# Patient Record
Sex: Male | Born: 1995 | Race: White | Hispanic: No | Marital: Single | Smoking: Never smoker
Health system: Southern US, Community
[De-identification: ages and names within clinical notes are randomized; demographics above are authoritative.]

## PROBLEM LIST (undated history)

## (undated) HISTORY — PX: NO PAST SURGERIES: SHX2092

---

## 2018-06-12 ENCOUNTER — Ambulatory Visit (INDEPENDENT_AMBULATORY_CARE_PROVIDER_SITE_OTHER): Payer: BLUE CROSS/BLUE SHIELD

## 2018-06-12 ENCOUNTER — Other Ambulatory Visit: Payer: Self-pay

## 2018-06-12 ENCOUNTER — Ambulatory Visit
Admission: EM | Admit: 2018-06-12 | Discharge: 2018-06-12 | Disposition: A | Discharge status: Home / Self Care | Payer: BLUE CROSS/BLUE SHIELD | Attending: Urgent Care | Admitting: Urgent Care

## 2018-06-12 DIAGNOSIS — Z7189 Other specified counseling: Secondary | ICD-10-CM

## 2018-06-12 DIAGNOSIS — R05 Cough: Secondary | ICD-10-CM

## 2018-06-12 DIAGNOSIS — B9789 Other viral agents as the cause of diseases classified elsewhere: Secondary | ICD-10-CM

## 2018-06-12 DIAGNOSIS — J069 Acute upper respiratory infection, unspecified: Secondary | ICD-10-CM

## 2018-06-12 DIAGNOSIS — M940 Chondrocostal junction syndrome [Tietze]: Secondary | ICD-10-CM | POA: Diagnosis not present

## 2018-06-12 DIAGNOSIS — R059 Cough, unspecified: Secondary | ICD-10-CM

## 2018-06-12 MED ORDER — IBUPROFEN 800 MG PO TABS: 800.0000 mg | ORAL_TABLET | Freq: Three times a day (TID) | ORAL | 0 refills | Status: AC | PRN

## 2018-06-12 MED ORDER — BENZONATATE 200 MG PO CAPS: 200.0000 mg | ORAL_CAPSULE | Freq: Three times a day (TID) | ORAL | 0 refills | Status: AC | PRN

## 2018-06-12 NOTE — Discharge Instructions (Addendum)
It was very nice meeting you today in clinic. Thank you for entrusting me with your care.   As discussed, your chest film was negative. Suspect your symptoms are viral in nature. No need for antibiotics at this time. I do believe your chest pain to be associated with inflammation. Will need anti-inflammatory medication to help with the pain.   Please utilize the medications that we discussed. Your prescriptions have been called in to your pharmacy. I have sent in anti-inflammatory medication and pills to help with your cough.   Make arrangements to follow up with your regular doctor in 1 week for re-evaluation if not improving. If your symptoms/condition worsens, please seek follow up care either here or in the ER. Please remember, our [facility] providers are "right here with you" when you need [place].   Again, it was my pleasure to take care of you today. Thank you for choosing our clinic. I hope that you start to feel better quickly.   Quentin Mulling, MSN, APRN, FNP-C, CEN Advanced Practice Provider [facility]

## 2018-06-12 NOTE — ED Provider Notes (Signed)
[address], [address] [place], [place] [address] [phone]   Name: Craig Walton DOB: [birth year] MRN: [id number] CSN: [id number] PCP: Patient, No Pcp Per  Arrival date and time:  06/12/18 1142  Chief Complaint:  Cough  NOTE: Prior to seeing the patient today, I have reviewed the triage nursing documentation and vital signs. Clinical staff has updated patient's PMH/PSHx, current medication list, and drug allergies/intolerances to ensure comprehensive history available to assist in medical decision making.   History:   HPI: Craig Walton is a 23 y.o. male who presents today with complaints of cough x 1 week. He initially experienced some associated pharyngitis, which resolved with conservative treatment at home. Patient notes that while at work earlier this morning he developed retrosternal chest pain.  Pain not reported to radiate into shoulder, jaw, neck, or subscapular area. He notes associated episodes of shortness of breath that are mainly associated with periods of intense coughing. No nausea, vomiting, or diaphoresis. Pain is reproducible with both cough and deep inspiration. Patient states, "it is like a knife sticking in my chest when I take a deep breath or cough".  He denies fevers. He has not been in close contact with anyone who has been sick recently.   History reviewed. No pertinent past medical history.  Past Surgical History:  Procedure Laterality Date  . NO PAST SURGERIES      History reviewed. No pertinent family history.  Social History   Socioeconomic History  . Marital status: Single    Spouse name: Not on file  . Number of children: Not on file  . Years of education: Not on file  . Highest education level: Not on file  Occupational History  . Not on file  Social Needs  . Financial resource strain: Not on file  . Food insecurity:    Worry: Not on file    Inability: Not on file  . Transportation needs:    Medical: Not on file    Non-medical: Not on  file  Tobacco Use  . Smoking status: Never Smoker  . Smokeless tobacco: Never Used  Substance and Sexual Activity  . Alcohol use: Never    Frequency: Never  . Drug use: Never  . Sexual activity: Not on file  Lifestyle  . Physical activity:    Days per week: Not on file    Minutes per session: Not on file  . Stress: Not on file  Relationships  . Social connections:    Talks on phone: Not on file    Gets together: Not on file    Attends religious service: Not on file    Active member of club or organization: Not on file    Attends meetings of clubs or organizations: Not on file    Relationship status: Not on file  . Intimate partner violence:    Fear of current or ex partner: Not on file    Emotionally abused: Not on file    Physically abused: Not on file    Forced sexual activity: Not on file  Other Topics Concern  . Not on file  Social History Narrative  . Not on file    There are no active problems to display for this patient.   Home Medications:    No outpatient medications have been marked as taking for the 06/12/18 encounter [facility](Hospital Encounter).    Allergies:   Patient has no known allergies.  Review of Systems (ROS): Review of Systems  Constitutional: Negative for chills and fever.  HENT: Positive for sore throat (initially with onset of symptoms x 1 week ago; resolved). Negative for congestion, ear pain, rhinorrhea and sinus pain.   Respiratory: Positive for cough (intermittently productive x 1 week) and shortness of breath (slight).   Cardiovascular: Positive for chest pain (pleuritic). Negative for palpitations.  Gastrointestinal: Negative for nausea and vomiting.  Musculoskeletal: Negative for back pain, myalgias and neck pain.  Neurological: Negative for dizziness, weakness and headaches.     Physical Exam:  Triage Vital Signs ED Triage Vitals  Enc Vitals Group     BP 06/12/18 1157 138/86     Pulse Rate 06/12/18 1157 85     Resp 06/12/18 1157 16      Temp 06/12/18 1157 98.8 F (37.1 C)     Temp Source 06/12/18 1157 Oral     SpO2 06/12/18 1157 100 %     Weight 06/12/18 1156 140 lb (63.5 kg)     Height 06/12/18 1156  (1.905 m)     Head Circumference --      Peak Flow --      Pain Score 06/12/18 1155 5     Pain Loc --      Pain Edu? --      Excl. in GC? --     Physical Exam  Constitutional: He is oriented to person, place, and time and well-developed, well-nourished, and in no distress.  HENT:  Head: Normocephalic and atraumatic.  Right Ear: External ear normal.  Left Ear: External ear normal.  Mouth/Throat: Mucous membranes are normal. Posterior oropharyngeal erythema (slight) present. No oropharyngeal exudate or posterior oropharyngeal edema.  Eyes: Pupils are equal, round, and reactive to light. EOM are normal.  Neck: Normal range of motion. Neck supple. No tracheal deviation present.  Cardiovascular: Normal rate, regular rhythm, normal heart sounds and intact distal pulses. Exam reveals no gallop and no friction rub.  No murmur heard. Pulmonary/Chest: Effort normal and breath sounds normal. No respiratory distress. He has no wheezes. He has no rales. He exhibits tenderness (generalized anterior chest wall).  Lymphadenopathy:    He has no cervical adenopathy.  Neurological: He is alert and oriented to person, place, and time.  Skin: Skin is warm and dry. No rash noted. No erythema.  Psychiatric: Mood, affect and judgment normal.  Nursing note and vitals reviewed.    Urgent Care Treatments / Results:   LABS: PLEASE NOTE: all labs that were ordered this encounter are listed, however only abnormal results are displayed. Labs Reviewed - No data to display  EKG: -None  RADIOLOGY: Dg Chest 2 View  Result Date: 06/12/2018 CLINICAL DATA:  Cough EXAM: CHEST - 2 VIEW COMPARISON:  None. FINDINGS: Lungs are clear. The heart size and pulmonary vascularity are normal. No adenopathy. No pneumothorax. No bone lesions.  There is slight midthoracic dextroscoliosis. There is an azygos lobe on the right, an anatomic variant. IMPRESSION: No edema or consolidation. Electronically Signed   By: Bretta Bang III M.D.   On: 06/12/2018 12:24    PRODEDURES: Procedures  MEDICATIONS RECEIVED THIS VISIT: Medications - No data to display  PERTINENT CLINICAL COURSE NOTES/UPDATES: No data to display   Initial Impression / Assessment and Plan / Urgent Care Course:    Craig Walton is a 23 y.o. male who presents to [facility] today with complaints of Cough  Pertinent labs & imaging results that were available during my care of the patient were personally reviewed by me and considered in my medical  decision making (see lab/imaging section of note for values and interpretations).  Exam reassuring. Pain in chest favored to be pleuritic in nature secondary to cough. Patient denies fevers. CXR reviewed as negative for any acute cardiopulmonary process; no consolidation, infiltrates, or evidence of peribronchial thickening. Symptoms consistent with a viral URI. Forceful coughing has caused patient to develop pleuritic chest pain. Pain reproducible with deep inspiration, cough, and when direct pressure applied to anterior chest wall. Suspect degree of associated costochondritis. Patient inquiring about COVID-19 infection. Patient is well appearing with very mild symptoms. Discussed that suspicion of COVID infection is low at this point, however in the absence of testing, it could not be ruled out. Discussed supportive symptomatic care at home. Will treat with IBU and benzonatate TID PRN. Patient encouraged to rest and increase fluid intake.   Current clinical condition warrants patient being out of work in order to recover from his current injury/illness. He was provided with the appropriate documentation to provide to his place of employment that will allow for him to RTW on 06/13/2018 with no restrictions.   Discussed  follow up with primary care physician in 1 week for re-evaluation. I have reviewed the follow up and strict return precautions for any new or worsening symptoms. Patient is aware of symptoms that would be deemed urgent/emergent, and would thus require further evaluation either here or in the emergency department. At the time of discharge, he verbalized understanding and consent with the discharge plan as it was reviewed with him. All questions were fielded by provider and/or clinic staff prior to patient discharge.    Final Clinical Impressions(s) / Urgent Care Diagnoses:   Final diagnoses:  Cough  Costochondritis  Viral URI with cough  Advice Given About Covid-19 Virus Infection    New Prescriptions:   Meds ordered this encounter  Medications  . ibuprofen (ADVIL) 800 MG tablet    Sig: Take 1 tablet (800 mg total) by mouth every 8 (eight) hours as needed.    Dispense:  15 tablet    Refill:  0  . benzonatate (TESSALON) 200 MG capsule    Sig: Take 1 capsule (200 mg total) by mouth 3 (three) times daily as needed for up to 7 days for cough.    Dispense:  21 capsule    Refill:  0    Controlled Substance Prescriptions:  [place] Controlled Substance Registry consulted? Not Applicable  NOTE: This note was prepared using Dragon dictation software along with smaller phrase technology. Despite my best ability to proofread, there is the potential that transcriptional errors may still occur from this process, and are completely unintentional.     Verlee Monte, NP 06/12/18 1245

## 2018-06-12 NOTE — ED Triage Notes (Signed)
Patient complains of cough that started 1 week ago and while at work this morning he started noticing a sharp pain in his chest. Patient denies fever or body aches.

## 2018-08-19 ENCOUNTER — Ambulatory Visit: Payer: BC Managed Care – PPO | Admitting: Podiatry

## 2018-08-19 ENCOUNTER — Encounter: Payer: Self-pay | Admitting: Podiatry

## 2018-08-19 ENCOUNTER — Other Ambulatory Visit: Payer: Self-pay

## 2018-08-19 VITALS — Temp 98.2°F

## 2018-08-19 DIAGNOSIS — L600 Ingrowing nail: Secondary | ICD-10-CM

## 2018-08-19 MED ORDER — GENTAMICIN SULFATE 0.1 % EX CREA: 1.0000 "application " | TOPICAL_CREAM | Freq: Two times a day (BID) | CUTANEOUS | 1 refills | Status: AC

## 2018-08-21 NOTE — Progress Notes (Signed)
   Subjective: Patient presents today for evaluation of pain to the medial border of the left hallux that began about one month ago. He reports some associated drainage. Patient is concerned for possible ingrown nail. Touching the toe increases the pain. He has been soaking it in Epsom salt for treatment. Patient presents today for further treatment and evaluation.  No past medical history on file.  Objective:  General: Well developed, nourished, in no acute distress, alert and oriented x3   Dermatology: Skin is warm, dry and supple bilateral. Medial border left hallux appears to be erythematous with evidence of an ingrowing nail. Pain on palpation noted to the border of the nail fold. The remaining nails appear unremarkable at this time. There are no open sores, lesions.  Vascular: Dorsalis Pedis artery and Posterior Tibial artery pedal pulses palpable. No lower extremity edema noted.   Neruologic: Grossly intact via light touch bilateral.  Musculoskeletal: Muscular strength within normal limits in all groups bilateral. Normal range of motion noted to all pedal and ankle joints.   Assesement: #1 Paronychia with ingrowing nail medial border left hallux  #2 Pain in toe #3 Incurvated nail  Plan of Care:  1. Patient evaluated.  2. Discussed treatment alternatives and plan of care. Explained nail avulsion procedure and post procedure course to patient. 3. Patient opted for permanent partial nail avulsion of the medial border left hallux.  4. Prior to procedure, local anesthesia infiltration utilized using 3 ml of a 50:50 mixture of 2% plain lidocaine and 0.5% plain marcaine in a normal hallux block fashion and a betadine prep performed.  5. Partial permanent nail avulsion with chemical matrixectomy performed using [id number] applications of phenol followed by alcohol flush.  6. Light dressing applied. 7. Prescription for Gentamicin cream provided to patient to use daily with a bandage.  8.  Return to clinic in 2 weeks.   Edrick Kins, DPM [facility]  Dr. Edrick Kins, [address]                                        [place], [place] [address]                Office [phone]  Fax [phone]

## 2018-09-05 ENCOUNTER — Ambulatory Visit: Payer: BC Managed Care – PPO | Admitting: Podiatry

## 2020-12-04 IMAGING — CR CHEST - 2 VIEW
2 series · 2 of 2 positions shown · non-contrast
Comparison: None.

CLINICAL DATA: Cough

EXAM:
CHEST - 2 VIEW

[chest pa]
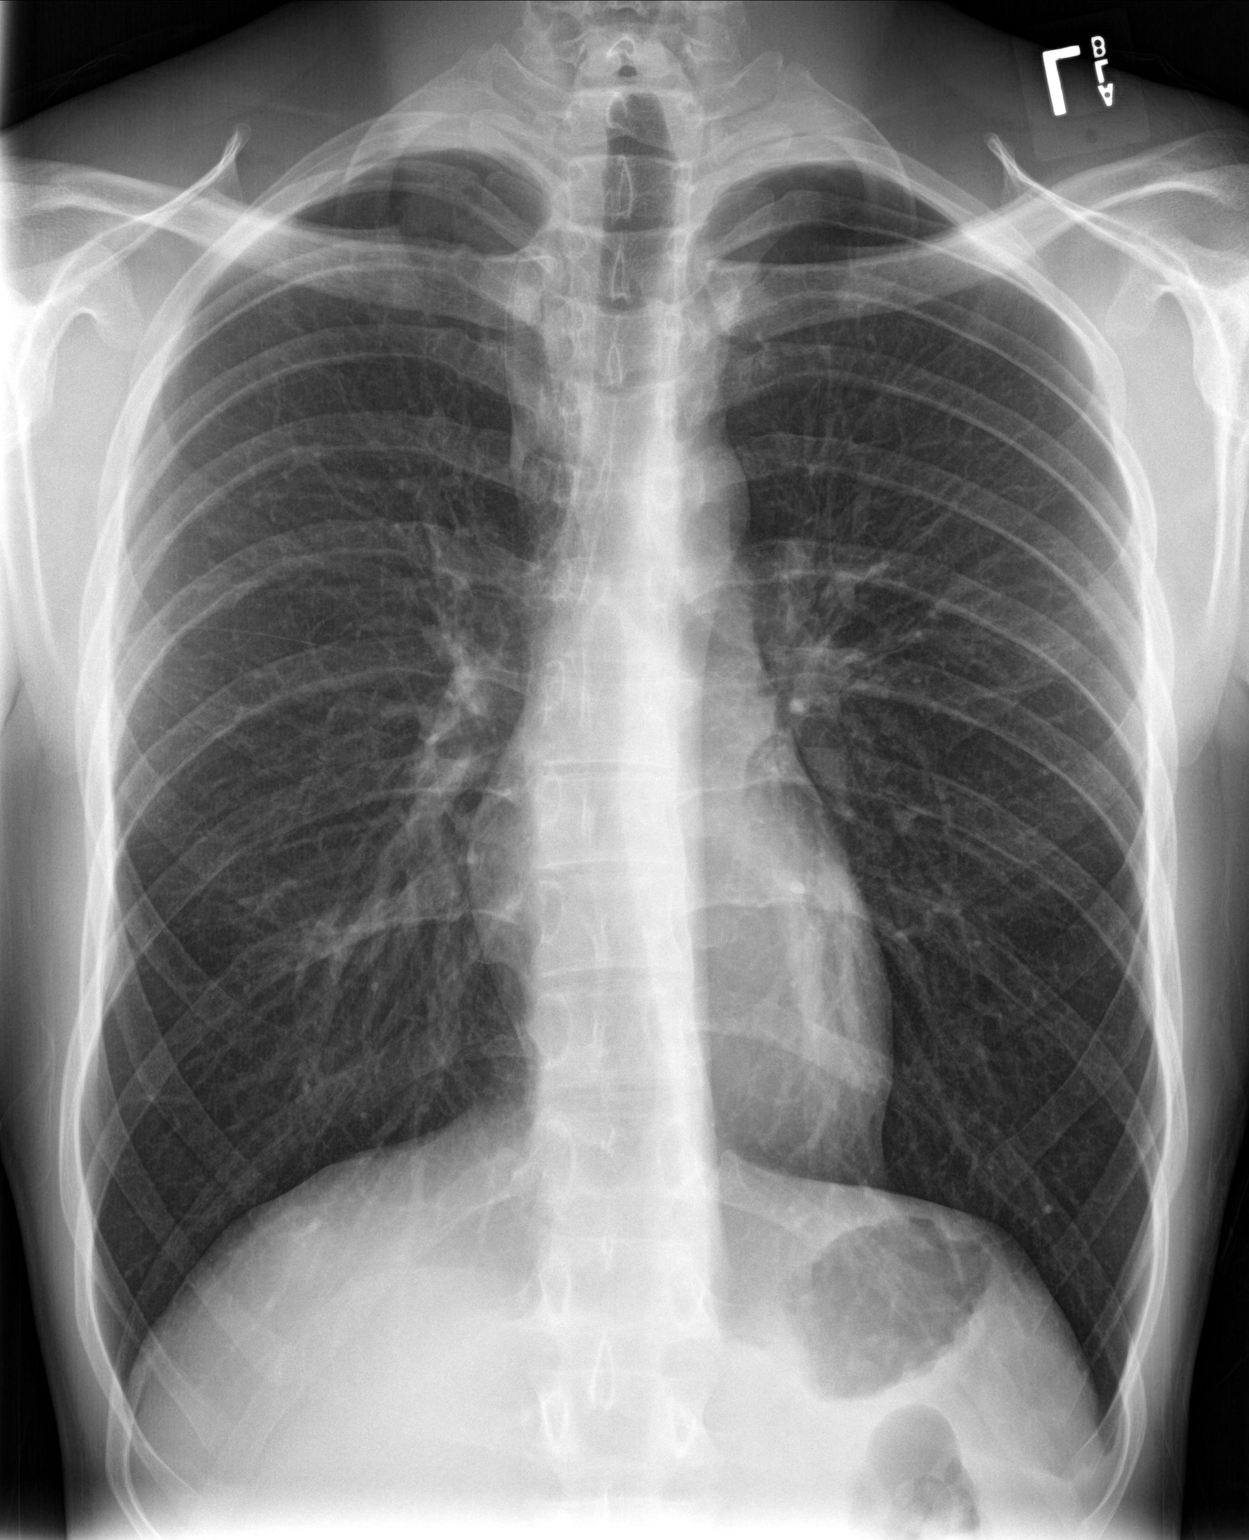

[chest lat]
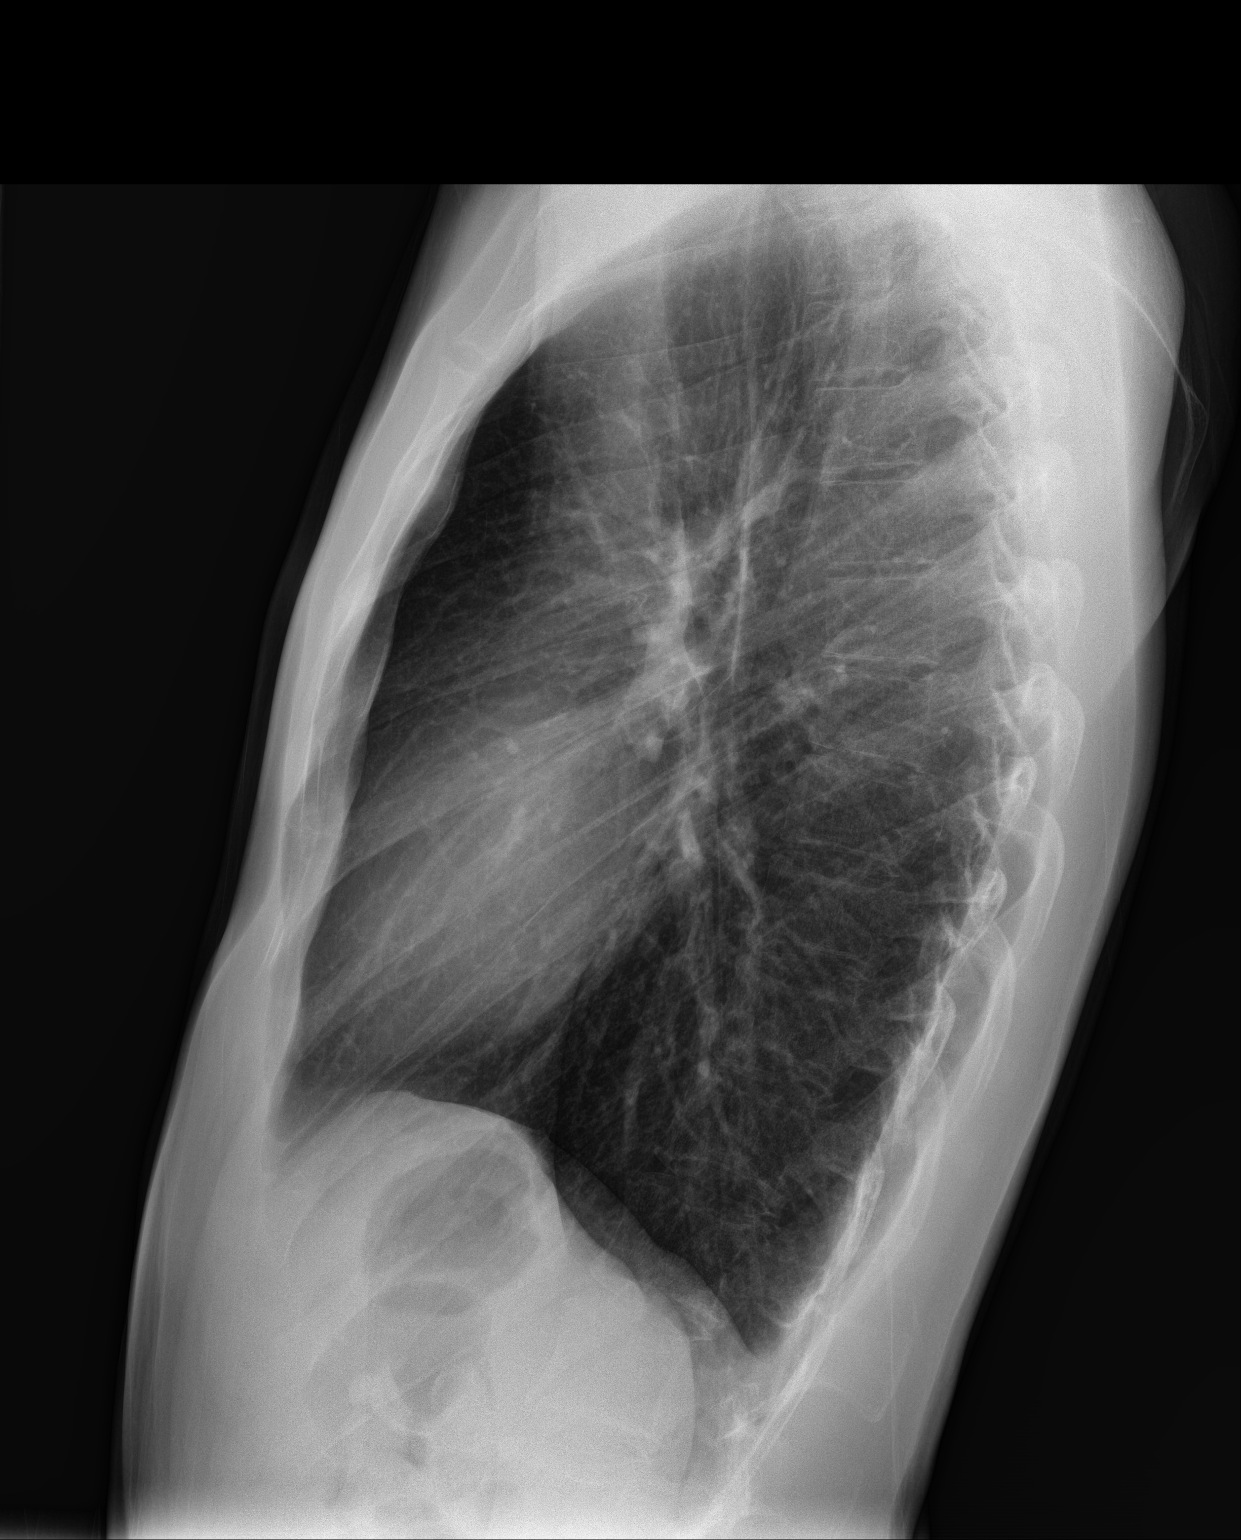

[2 of 2 positions shown; findings below may reference images not displayed]

FINDINGS: Lungs are clear. The heart size and pulmonary vascularity are
normal. No adenopathy. No pneumothorax. No bone lesions. There is
slight midthoracic dextroscoliosis.

There is an azygos lobe on the right, an anatomic variant.
IMPRESSION: No edema or consolidation.
